# Patient Record
Sex: Male | Born: 1980 | Race: Black or African American | Hispanic: No | Marital: Single | Smoking: Never smoker
Health system: Southern US, Community
[De-identification: ages and names within clinical notes are randomized; demographics above are authoritative.]

## PROBLEM LIST (undated history)

## (undated) HISTORY — PX: HAND SURGERY: SHX662

---

## 2004-04-20 ENCOUNTER — Emergency Department (HOSPITAL_COMMUNITY): Admission: EM | Admit: 2004-04-20 | Discharge: 2004-04-20 | Payer: Self-pay | Admitting: Emergency Medicine

## 2004-04-23 ENCOUNTER — Ambulatory Visit (HOSPITAL_COMMUNITY): Admission: RE | Admit: 2004-04-23 | Discharge: 2004-04-23 | Payer: Self-pay | Admitting: Orthopedic Surgery

## 2004-04-30 ENCOUNTER — Encounter: Admission: RE | Admit: 2004-04-30 | Discharge: 2004-05-31 | Payer: Self-pay | Admitting: Orthopedic Surgery

## 2004-05-25 ENCOUNTER — Ambulatory Visit (HOSPITAL_BASED_OUTPATIENT_CLINIC_OR_DEPARTMENT_OTHER): Admission: RE | Admit: 2004-05-25 | Discharge: 2004-05-25 | Payer: Self-pay | Admitting: Orthopedic Surgery

## 2005-02-07 ENCOUNTER — Emergency Department (HOSPITAL_COMMUNITY): Admission: EM | Admit: 2005-02-07 | Discharge: 2005-02-07 | Payer: Self-pay | Admitting: Family Medicine

## 2005-03-07 IMAGING — CR DG HAND COMPLETE 3+V*R*
3 series · 3 of 3 positions shown · non-contrast
Comparison: none

CLINICAL DATA: Fall; injury
 RIGHT HAND THREE VIEWS, 04/20/04, 8911 HOURS
 There is a fracture through the proximal metaphysis of the fifth metacarpal.  There is dorsal and ulnar displacement of the distal fracture fragment.  Soft tissue swelling of the overlying tissues is present.  
 IMPRESSION
 Fifth metacarpal fracture.

[view not recorded (1 of 3)]
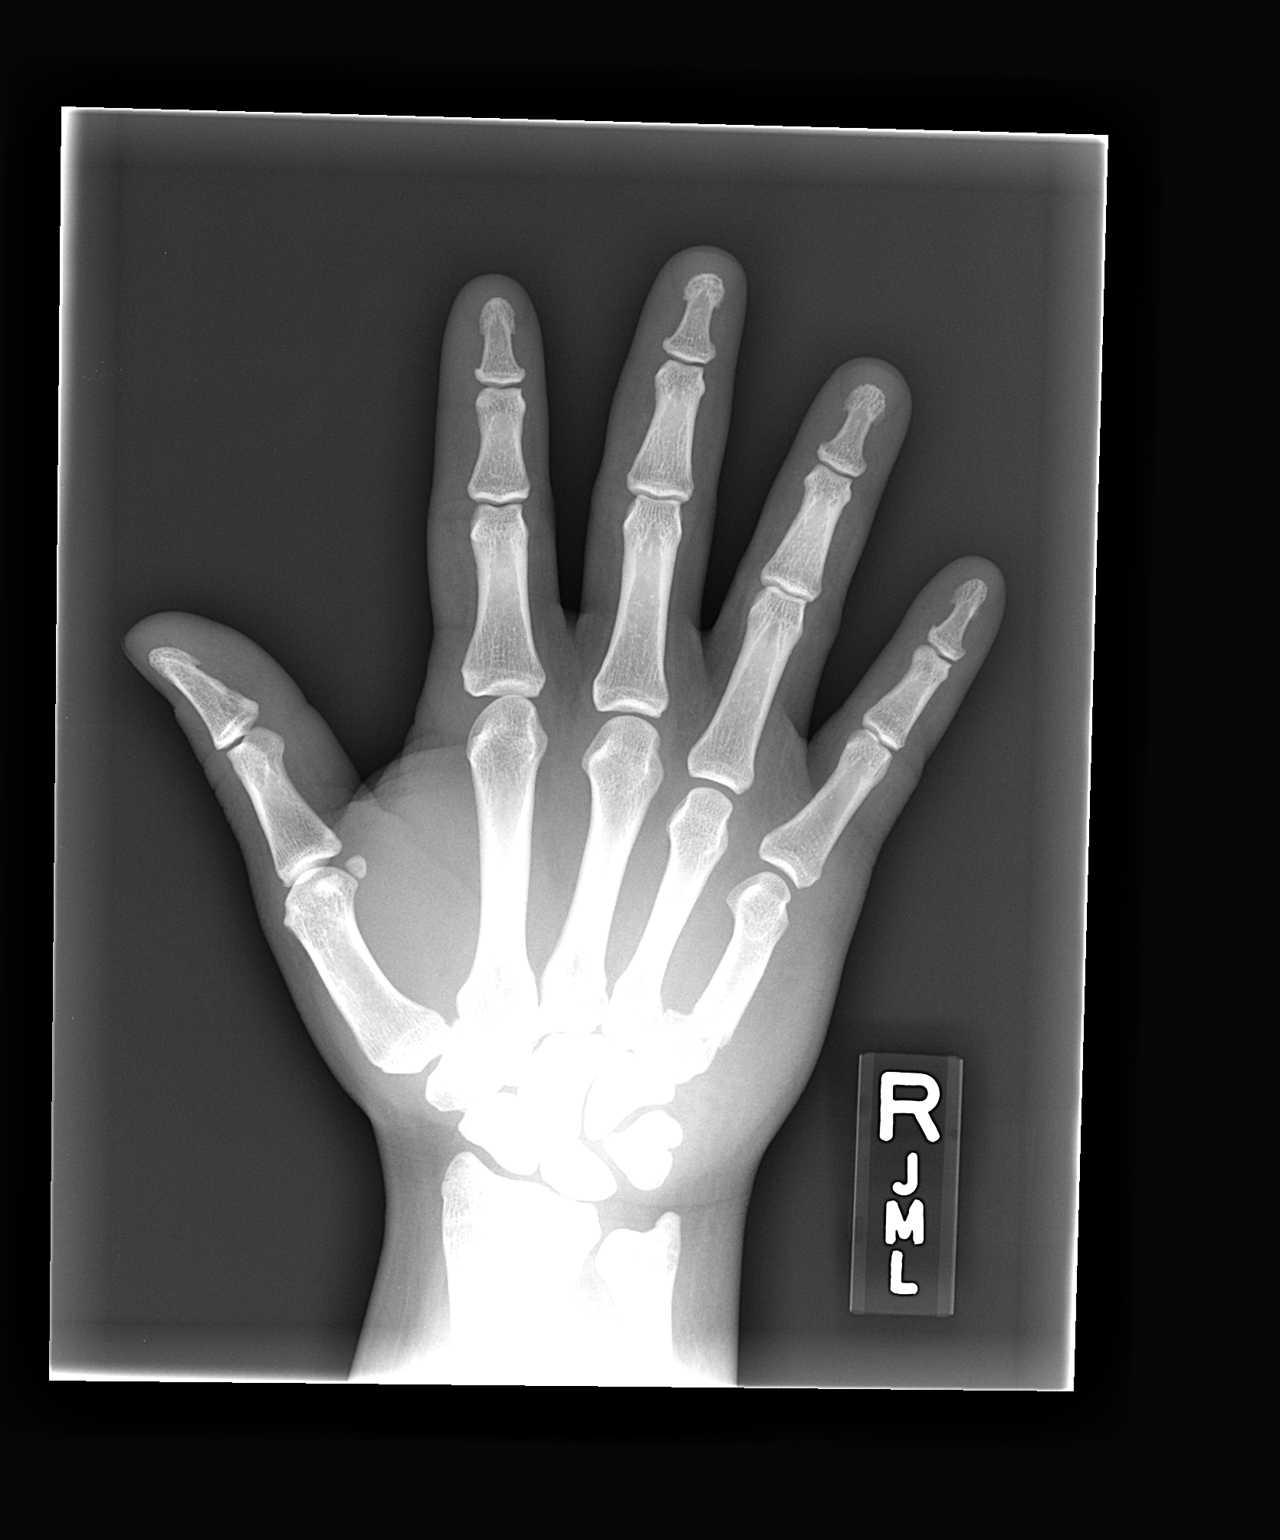

[view not recorded (2 of 3)]
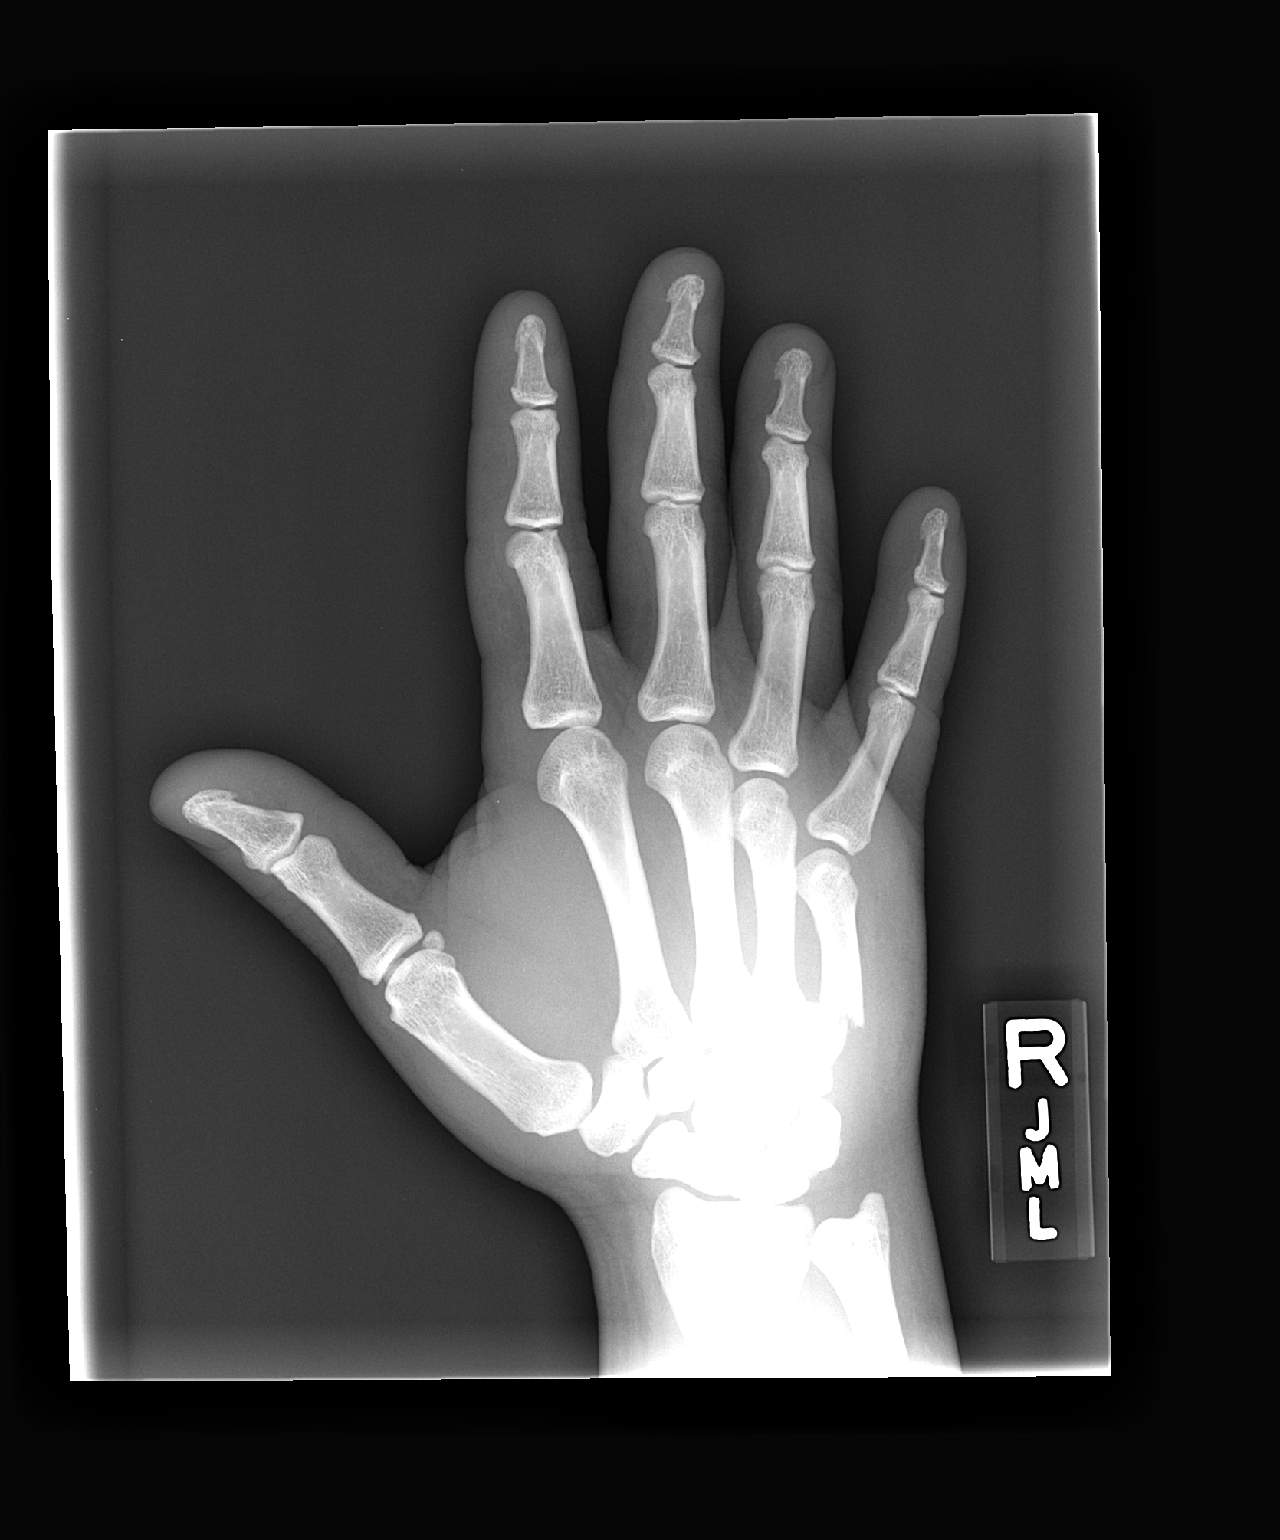

[view not recorded (3 of 3)]
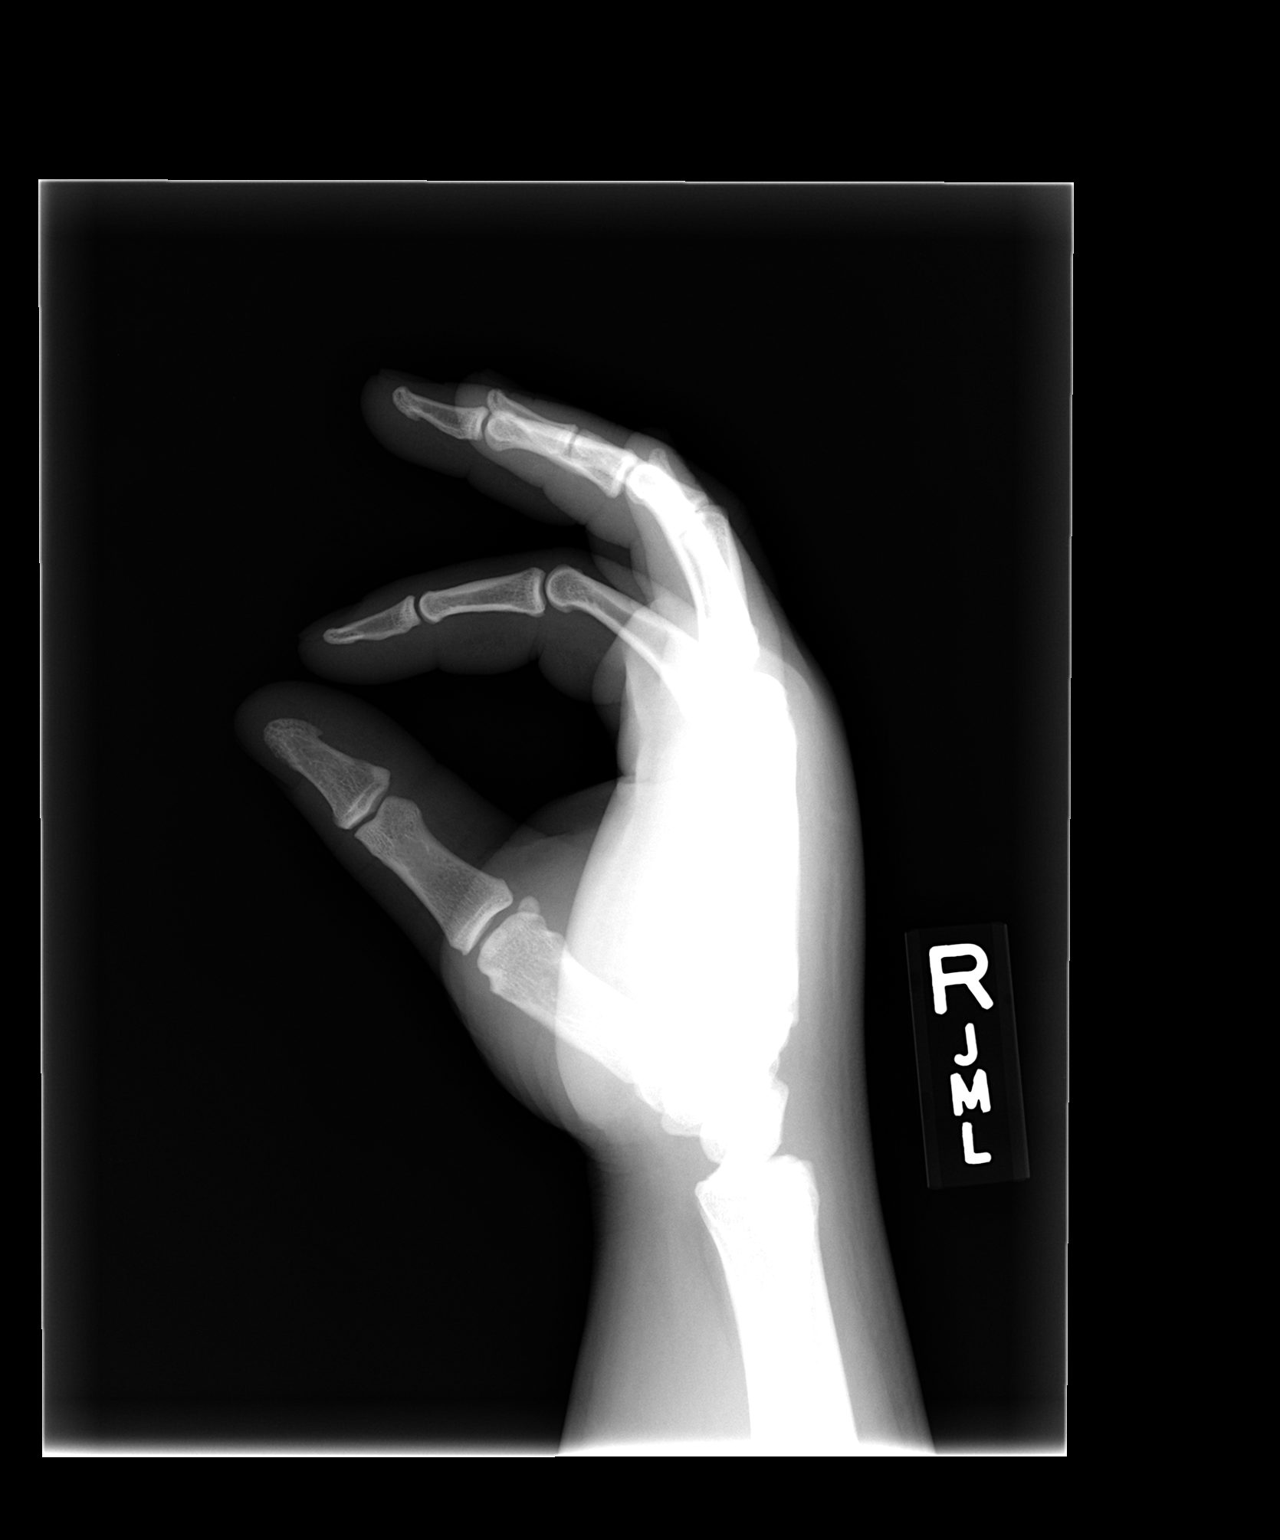

[3 of 3 positions shown; findings below may reference images not displayed]

## 2007-01-24 ENCOUNTER — Emergency Department (HOSPITAL_COMMUNITY): Admission: EM | Admit: 2007-01-24 | Discharge: 2007-01-24 | Payer: Self-pay | Admitting: Family Medicine

## 2010-11-19 NOTE — Op Note (Signed)
NAME:  Jonathon Branch, DISE NO.:  [id number]   MEDICAL RECORD NO.:  [id number]          PATIENT TYPE:  OIB   LOCATION:  2899                         FACILITY:  [facility]   PHYSICIAN:  Cindee Salt, M.D.       DATE OF BIRTH:  1981-02-25   DATE OF PROCEDURE:  04/23/2004  DATE OF DISCHARGE:                                 OPERATIVE REPORT   PREOPERATIVE DIAGNOSIS:  Fractured fifth metacarpal, right hand.   POSTOPERATIVE DIAGNOSIS:  Fractured fifth metacarpal, right hand.   OPERATION:  Closed reduction and percutaneous pinning, fifth metacarpal,  right hand.   SURGEON:  Cindee Salt, M.D.   ASSISTANTCarolyne Fiscal   ANESTHESIA:  General .   HISTORY:  The patient is a 30 year old male who suffered a fall and  fractured his fifth metacarpal.  This is a transverse fracture just distal  to the base.   DESCRIPTION OF PROCEDURE:  The patient is brought to the operating room  where a general anesthetic was carried out without difficulty.  He was  prepped and draped using DuraPrep in a supine position, right arm free.  The  image intensifier was used to reduce the fracture.  This was done with a  moderate amount of difficulty due to the positioning of the fracture.  This  was pinned with three K-wires, one 4.5 and two 3.5 K-wires, two proximally,  one distally.  X-rays in the AP, lateral, and oblique direction revealed  that the fracture was reduced.  The pins were bent and cut short.  A sterile  compressive dressing and ulnar gutter splint applied.  The patient tolerated  the procedure well and was taken to the recovery room for observation in  satisfactory condition.  He is discharged home to return to the [facility]  in [place] in one week on Vicodin.       GK/MEDQ  D:  04/23/2004  T:  04/23/2004  Job:  [id number]

## 2011-04-18 LAB — POCT URINALYSIS DIP (DEVICE)
Bilirubin Urine: NEGATIVE
Glucose, UA: NEGATIVE
Hgb urine dipstick: NEGATIVE
Ketones, ur: NEGATIVE
Nitrite: NEGATIVE
Operator id: 239701
Specific Gravity, Urine: 1.02
Urobilinogen, UA: 0.2
pH: 7

## 2017-12-26 ENCOUNTER — Other Ambulatory Visit: Payer: Self-pay

## 2017-12-26 ENCOUNTER — Ambulatory Visit (HOSPITAL_COMMUNITY)
Admission: EM | Admit: 2017-12-26 | Discharge: 2017-12-26 | Disposition: A | Discharge status: Home / Self Care | Payer: Self-pay | Attending: Family Medicine | Admitting: Family Medicine

## 2017-12-26 ENCOUNTER — Encounter (HOSPITAL_COMMUNITY): Payer: Self-pay | Admitting: Emergency Medicine

## 2017-12-26 DIAGNOSIS — B309 Viral conjunctivitis, unspecified: Secondary | ICD-10-CM

## 2017-12-26 MED ORDER — POLYMYXIN B-TRIMETHOPRIM 10000-0.1 UNIT/ML-% OP SOLN: 1.0000 [drp] | OPHTHALMIC | 0 refills | Status: AC

## 2017-12-26 NOTE — ED Triage Notes (Signed)
[facility] a [facility] left eye redness and draining x 2 days.

## 2017-12-26 NOTE — Discharge Instructions (Addendum)
Viral Conjunctivitis- this is self-limiting and will improve on its own, may worsen for 3-5 days, but should resolve in 10-14 days  - Have Good Hand Hygiene  - Use Cold Compresses  - Artificial Tears 5-6 times a day  - Use Naphcon-A for redness and itching relief  - Use Polytrim eye drops (2 drops 4 times a day for 5-7 days)   Please return if developing pain, changes in vision, swelling around eye, worsening symptoms, not improving in 1 week

## 2017-12-26 NOTE — ED Provider Notes (Signed)
[facility]    CSN: [id number] Arrival date & time: 12/26/17  1444     History   Chief Complaint Chief Complaint  Patient presents with  . Eye Drainage    HPI Jonathon Branch is a 37 y.o. male presenting today for evaluation of left eye redness and irritation.  He states that Sunday evening, approximately 2 days ago he developed a gritty sensation in his eyes as well as itching and slight redness.  He has noted some crusting and drainage especially in the morning, also noting increased puffiness and swelling in the morning.  Denies any pain.  Denies changes in vision.  Does not wear contacts.  Does not have associated URI symptoms.  He has tried over-the-counter [employer] pinkeye relief drops which have helped with the grittiness and irritation.  HPI  History reviewed. No pertinent past medical history.  There are no active problems to display for this patient.   Past Surgical History:  Procedure Laterality Date  . HAND SURGERY Right        Home Medications    Prior to Admission medications   Medication Sig Start Date End Date Taking? Authorizing Provider  trimethoprim-polymyxin b (POLYTRIM) ophthalmic solution Place 1 drop into the left eye every 4 (four) hours. 12/26/17   Melvin Whiteford, Junius Creamer, PA-C    Family History History reviewed. No pertinent family history.  Social History Social History   Tobacco Use  . Smoking status: Never Smoker  . Smokeless tobacco: Never Used  Substance Use Topics  . Alcohol use: Yes    Frequency: Never    Comment: Occ  . Drug use: Never     Allergies   Patient has no known allergies.   Review of Systems Review of Systems  Constitutional: Negative for activity change, appetite change, chills, fatigue and fever.  HENT: Negative for congestion, ear pain, rhinorrhea, sore throat and trouble swallowing.   Eyes: Positive for discharge, redness and itching. Negative for photophobia, pain and visual disturbance.  Respiratory:  Negative for cough, chest tightness and shortness of breath.   Cardiovascular: Negative for chest pain.  Gastrointestinal: Negative for abdominal pain, diarrhea, nausea and vomiting.  Musculoskeletal: Negative for myalgias.  Skin: Negative for rash.  Neurological: Negative for dizziness, light-headedness and headaches.     Physical Exam Triage Vital Signs ED Triage Vitals [12/26/17 1504]  Enc Vitals Group     BP (!) 144/84     Pulse Rate 63     Resp 18     Temp 98.3 F (36.8 C)     Temp Source Oral     SpO2 98 %     Weight      Height      Head Circumference      Peak Flow      Pain Score 0     Pain Loc      Pain Edu?      Excl. in GC?    No data found.  Updated Vital Signs BP (!) 144/84 (BP Location: Left Arm)   Pulse 63   Temp 98.3 F (36.8 C) (Oral)   Resp 18   SpO2 98%   Visual Acuity Right Eye Distance:   Left Eye Distance:   Bilateral Distance:    Right Eye Near: R Near: 20/20 Left Eye Near:  L Near: 20/20 Bilateral Near:  20/20  Physical Exam  Constitutional: He appears well-developed and well-nourished.  HENT:  Head: Normocephalic and atraumatic.  Eyes: Pupils are equal, round,  and reactive to light. Conjunctivae and EOM are normal.  PERRL EOM intact bilaterally, conjugate gaze; left conjunctivo-mildly erythematous, no drainage or crusting seen on lids or eyelashes.  No corneal opacity.   Neck: Neck supple.  Cardiovascular: Normal rate and regular rhythm.  No murmur heard. Pulmonary/Chest: Effort normal and breath sounds normal. No respiratory distress.  Abdominal: Soft. There is no tenderness.  Musculoskeletal: He exhibits no edema.  Neurological: He is alert.  Skin: Skin is warm and dry.  Psychiatric: He has a normal mood and affect.  Nursing note and vitals reviewed.    UC Treatments / Results  Labs (all labs ordered are listed, but only abnormal results are displayed) Labs Reviewed - No data to display  EKG None  Radiology No  results found.  Procedures Procedures (including critical care time)  Medications Ordered in UC Medications - No data to display  Initial Impression / Assessment and Plan / UC Course  I have reviewed the triage vital signs and the nursing notes.  Pertinent labs & imaging results that were available during my care of the patient were reviewed by me and considered in my medical decision making (see chart for details).     Patient likely with conjunctivitis, viral versus bacterial.  No change in visual acuity.  Will provide Polytrim eyedrops to use as well as may continue to use OTC eye relief as these have been helping him.  Cold compresses and good hand hygiene.Discussed strict return precautions. Patient verbalized understanding and is agreeable with plan.  Final Clinical Impressions(s) / UC Diagnoses   Final diagnoses:  Acute viral conjunctivitis of left eye     Discharge Instructions     Viral Conjunctivitis- this is self-limiting and will improve on its own, may worsen for 3-5 days, but should resolve in 10-14 days  - Have Good Hand Hygiene  - Use Cold Compresses  - Artificial Tears 5-6 times a day  - Use Naphcon-A for redness and itching relief  - Use Polytrim eye drops (2 drops 4 times a day for 5-7 days)   Please return if developing pain, changes in vision, swelling around eye, worsening symptoms, not improving in 1 week    ED Prescriptions    Medication Sig Dispense Auth. Provider   trimethoprim-polymyxin b (POLYTRIM) ophthalmic solution Place 1 drop into the left eye every 4 (four) hours. 10 mL Braxley Balandran C, PA-C     Controlled Substance Prescriptions [place] Controlled Substance Registry consulted? Not Applicable   Lew DawesWieters, Beverley Allender C, [place]PA-C 12/26/17 1535
